# Patient Record
Sex: Female | Born: 1935 | Race: White | Hispanic: No | Marital: Married
Health system: Southern US, Community
[De-identification: ages and names within clinical notes are randomized; demographics above are authoritative.]

---

## 2000-03-27 ENCOUNTER — Encounter: Payer: Self-pay | Admitting: Thoracic Surgery (Cardiothoracic Vascular Surgery)

## 2000-03-27 ENCOUNTER — Inpatient Hospital Stay (HOSPITAL_COMMUNITY)
Admission: AD | Admit: 2000-03-27 | Discharge: 2000-04-02 | Payer: Self-pay | Admitting: Thoracic Surgery (Cardiothoracic Vascular Surgery)

## 2000-03-28 ENCOUNTER — Encounter: Payer: Self-pay | Admitting: Thoracic Surgery (Cardiothoracic Vascular Surgery)

## 2000-03-29 ENCOUNTER — Encounter: Payer: Self-pay | Admitting: Thoracic Surgery (Cardiothoracic Vascular Surgery)

## 2000-03-30 ENCOUNTER — Encounter: Payer: Self-pay | Admitting: Thoracic Surgery (Cardiothoracic Vascular Surgery)

## 2003-07-01 ENCOUNTER — Other Ambulatory Visit: Payer: Self-pay

## 2004-12-28 ENCOUNTER — Encounter: Admission: RE | Admit: 2004-12-28 | Discharge: 2004-12-28 | Payer: Self-pay | Admitting: Internal Medicine

## 2006-04-03 ENCOUNTER — Encounter: Admission: RE | Admit: 2006-04-03 | Discharge: 2006-04-03 | Payer: Self-pay | Admitting: Internal Medicine

## 2006-07-25 ENCOUNTER — Encounter: Admission: RE | Admit: 2006-07-25 | Discharge: 2006-07-25 | Payer: Self-pay | Admitting: Internal Medicine

## 2007-02-19 ENCOUNTER — Emergency Department: Payer: Self-pay | Admitting: Emergency Medicine

## 2007-02-19 ENCOUNTER — Other Ambulatory Visit: Payer: Self-pay

## 2008-05-24 ENCOUNTER — Ambulatory Visit: Payer: Self-pay | Admitting: Internal Medicine

## 2008-06-20 ENCOUNTER — Ambulatory Visit: Payer: Self-pay | Admitting: Internal Medicine

## 2008-07-31 ENCOUNTER — Emergency Department: Payer: Self-pay | Admitting: Emergency Medicine

## 2008-08-04 ENCOUNTER — Ambulatory Visit: Payer: Self-pay | Admitting: General Practice

## 2008-08-05 ENCOUNTER — Ambulatory Visit: Payer: Self-pay | Admitting: General Practice

## 2008-08-06 ENCOUNTER — Inpatient Hospital Stay: Payer: Self-pay | Admitting: Internal Medicine

## 2008-08-12 ENCOUNTER — Encounter: Payer: Self-pay | Admitting: Internal Medicine

## 2008-08-20 ENCOUNTER — Encounter: Payer: Self-pay | Admitting: Internal Medicine

## 2008-09-20 ENCOUNTER — Encounter: Payer: Self-pay | Admitting: Internal Medicine

## 2008-11-01 ENCOUNTER — Encounter: Payer: Self-pay | Admitting: General Practice

## 2008-11-18 ENCOUNTER — Encounter: Payer: Self-pay | Admitting: General Practice

## 2008-12-04 ENCOUNTER — Inpatient Hospital Stay: Payer: Self-pay | Admitting: Internal Medicine

## 2008-12-18 ENCOUNTER — Encounter: Payer: Self-pay | Admitting: General Practice

## 2010-04-04 ENCOUNTER — Ambulatory Visit: Payer: Self-pay | Admitting: Otolaryngology

## 2011-03-12 ENCOUNTER — Emergency Department: Payer: Self-pay | Admitting: Emergency Medicine

## 2011-06-04 ENCOUNTER — Ambulatory Visit: Payer: Self-pay | Admitting: Gastroenterology

## 2011-06-19 ENCOUNTER — Encounter: Payer: Self-pay | Admitting: Internal Medicine

## 2011-06-21 ENCOUNTER — Encounter: Payer: Self-pay | Admitting: Internal Medicine

## 2011-07-07 ENCOUNTER — Inpatient Hospital Stay: Payer: Self-pay | Admitting: Internal Medicine

## 2011-07-18 ENCOUNTER — Inpatient Hospital Stay: Payer: Self-pay | Admitting: Internal Medicine

## 2011-12-26 ENCOUNTER — Ambulatory Visit: Payer: Self-pay | Admitting: Internal Medicine

## 2011-12-26 LAB — RETICULOCYTES
Absolute Retic Count: 0.0732 10*6/uL (ref 0.024–0.084)
Reticulocyte: 2.24 % — ABNORMAL HIGH (ref 0.5–1.5)

## 2011-12-26 LAB — CANCER CENTER HEMOGLOBIN: HGB: 8.1 g/dL — ABNORMAL LOW (ref 12.0–16.0)

## 2011-12-26 LAB — IRON AND TIBC: Iron: 25 ug/dL — ABNORMAL LOW (ref 50–170)

## 2011-12-26 LAB — LACTATE DEHYDROGENASE: LDH: 167 U/L (ref 84–246)

## 2012-01-19 ENCOUNTER — Ambulatory Visit: Payer: Self-pay | Admitting: Internal Medicine

## 2012-09-18 ENCOUNTER — Inpatient Hospital Stay: Payer: Self-pay | Admitting: Internal Medicine

## 2012-09-18 LAB — URINALYSIS, COMPLETE
Bilirubin,UR: NEGATIVE
Glucose,UR: NEGATIVE mg/dL (ref 0–75)
Ketone: NEGATIVE
RBC,UR: NONE SEEN /HPF (ref 0–5)
WBC UR: 15 /HPF (ref 0–5)

## 2012-09-18 LAB — COMPREHENSIVE METABOLIC PANEL
Albumin: 3.3 g/dL — ABNORMAL LOW (ref 3.4–5.0)
Anion Gap: 8 (ref 7–16)
Bilirubin,Total: 0.2 mg/dL (ref 0.2–1.0)
Calcium, Total: 8.7 mg/dL (ref 8.5–10.1)
Creatinine: 1.32 mg/dL — ABNORMAL HIGH (ref 0.60–1.30)
EGFR (African American): 43 — ABNORMAL LOW
EGFR (Non-African Amer.): 37 — ABNORMAL LOW
Glucose: 71 mg/dL (ref 65–99)
Potassium: 3.9 mmol/L (ref 3.5–5.1)
SGPT (ALT): 18 U/L (ref 12–78)
Sodium: 139 mmol/L (ref 136–145)

## 2012-09-18 LAB — CBC
MCH: 26.5 pg (ref 26.0–34.0)
MCHC: 32 g/dL (ref 32.0–36.0)
MCV: 83 fL (ref 80–100)
Platelet: 328 10*3/uL (ref 150–440)
RBC: 3.64 10*6/uL — ABNORMAL LOW (ref 3.80–5.20)
RDW: 17 % — ABNORMAL HIGH (ref 11.5–14.5)
WBC: 11.7 10*3/uL — ABNORMAL HIGH (ref 3.6–11.0)

## 2012-09-18 LAB — TROPONIN I: Troponin-I: <0.02 ng/mL

## 2012-09-19 LAB — BASIC METABOLIC PANEL
BUN: 21 mg/dL — ABNORMAL HIGH (ref 7–18)
Potassium: 4.2 mmol/L (ref 3.5–5.1)
Sodium: 139 mmol/L (ref 136–145)

## 2012-09-19 LAB — CBC WITH DIFFERENTIAL/PLATELET
Eosinophil %: 0.9 %
HCT: 29.4 % — ABNORMAL LOW (ref 35.0–47.0)
MCH: 26 pg (ref 26.0–34.0)
Monocyte #: 0.7 x10 3/mm (ref 0.2–0.9)
Monocyte %: 5.5 %
Platelet: 324 10*3/uL (ref 150–440)
RBC: 3.54 10*6/uL — ABNORMAL LOW (ref 3.80–5.20)
RDW: 17.1 % — ABNORMAL HIGH (ref 11.5–14.5)
WBC: 12.7 10*3/uL — ABNORMAL HIGH (ref 3.6–11.0)

## 2012-09-19 LAB — LIPID PANEL: VLDL Cholesterol, Calc: 33 mg/dL (ref 5–40)

## 2012-09-20 LAB — BASIC METABOLIC PANEL
Anion Gap: 7 (ref 7–16)
BUN: 23 mg/dL — ABNORMAL HIGH (ref 7–18)
Calcium, Total: 7.8 mg/dL — ABNORMAL LOW (ref 8.5–10.1)
Chloride: 105 mmol/L (ref 98–107)
Co2: 25 mmol/L (ref 21–32)
Creatinine: 1.44 mg/dL — ABNORMAL HIGH (ref 0.60–1.30)
EGFR (African American): 39 — ABNORMAL LOW
EGFR (Non-African Amer.): 34 — ABNORMAL LOW
Glucose: 311 mg/dL — ABNORMAL HIGH (ref 65–99)
Osmolality: 289 (ref 275–301)
Potassium: 4.5 mmol/L (ref 3.5–5.1)
Sodium: 137 mmol/L (ref 136–145)

## 2012-09-20 LAB — CBC WITH DIFFERENTIAL/PLATELET
Eosinophil #: 0 10*3/uL (ref 0.0–0.7)
Eosinophil %: 0 %
HCT: 23.1 % — ABNORMAL LOW (ref 35.0–47.0)
Lymphocyte #: 0.9 10*3/uL — ABNORMAL LOW (ref 1.0–3.6)
Lymphocyte %: 8.7 %
MCH: 27 pg (ref 26.0–34.0)
MCHC: 32.2 g/dL (ref 32.0–36.0)
MCV: 84 fL (ref 80–100)
Monocyte #: 0.5 x10 3/mm (ref 0.2–0.9)
Neutrophil #: 8.4 10*3/uL — ABNORMAL HIGH (ref 1.4–6.5)
Neutrophil %: 85.6 %
RDW: 16.9 % — ABNORMAL HIGH (ref 11.5–14.5)

## 2012-09-20 LAB — URINE CULTURE

## 2012-09-21 LAB — BASIC METABOLIC PANEL
Anion Gap: 9 (ref 7–16)
BUN: 24 mg/dL — ABNORMAL HIGH (ref 7–18)
Calcium, Total: 8.5 mg/dL (ref 8.5–10.1)
Chloride: 99 mmol/L (ref 98–107)
Glucose: 210 mg/dL — ABNORMAL HIGH (ref 65–99)
Potassium: 4.3 mmol/L (ref 3.5–5.1)

## 2012-09-21 LAB — CBC WITH DIFFERENTIAL/PLATELET
Basophil %: 0.2 %
Eosinophil #: 0.1 10*3/uL (ref 0.0–0.7)
Eosinophil %: 0.7 %
HGB: 7.6 g/dL — ABNORMAL LOW (ref 12.0–16.0)
Lymphocyte #: 1.5 10*3/uL (ref 1.0–3.6)
Lymphocyte %: 11.6 %
MCH: 26.4 pg (ref 26.0–34.0)
MCHC: 31.7 g/dL — ABNORMAL LOW (ref 32.0–36.0)
Neutrophil %: 79.8 %
Platelet: 271 10*3/uL (ref 150–440)
RDW: 17 % — ABNORMAL HIGH (ref 11.5–14.5)
WBC: 13 10*3/uL — ABNORMAL HIGH (ref 3.6–11.0)

## 2012-09-22 LAB — CBC WITH DIFFERENTIAL/PLATELET
Basophil #: 0 10*3/uL (ref 0.0–0.1)
Basophil %: 0.2 %
MCV: 83 fL (ref 80–100)
Monocyte %: 6.2 %
Platelet: 295 10*3/uL (ref 150–440)
RBC: 2.94 10*6/uL — ABNORMAL LOW (ref 3.80–5.20)
RDW: 17 % — ABNORMAL HIGH (ref 11.5–14.5)

## 2012-09-22 LAB — BASIC METABOLIC PANEL
Anion Gap: 8 (ref 7–16)
BUN: 20 mg/dL — ABNORMAL HIGH (ref 7–18)
EGFR (African American): 57 — ABNORMAL LOW
Potassium: 4.1 mmol/L (ref 3.5–5.1)

## 2012-09-25 LAB — CBC WITH DIFFERENTIAL/PLATELET
Basophil %: 0.2 %
HGB: 7.2 g/dL — ABNORMAL LOW (ref 12.0–16.0)
Lymphocyte #: 1.4 10*3/uL (ref 1.0–3.6)
Lymphocyte %: 12.7 %
MCHC: 31.6 g/dL — ABNORMAL LOW (ref 32.0–36.0)
Monocyte %: 8.2 %
Neutrophil #: 8.5 10*3/uL — ABNORMAL HIGH (ref 1.4–6.5)
Platelet: 289 10*3/uL (ref 150–440)
RDW: 16.6 % — ABNORMAL HIGH (ref 11.5–14.5)
WBC: 11.2 10*3/uL — ABNORMAL HIGH (ref 3.6–11.0)

## 2012-09-26 ENCOUNTER — Encounter: Payer: Self-pay | Admitting: Internal Medicine

## 2012-10-18 ENCOUNTER — Encounter: Payer: Self-pay | Admitting: Internal Medicine

## 2012-10-29 LAB — URINALYSIS, COMPLETE
Bilirubin,UR: NEGATIVE
Ketone: NEGATIVE
Nitrite: NEGATIVE
Ph: 7 (ref 4.5–8.0)
RBC,UR: 16 /HPF (ref 0–5)
WBC UR: 312 /HPF (ref 0–5)

## 2012-11-03 LAB — URINE CULTURE

## 2012-11-07 LAB — URINALYSIS, COMPLETE
Glucose,UR: 50 mg/dL (ref 0–75)
Nitrite: NEGATIVE
Ph: 5 (ref 4.5–8.0)
RBC,UR: 160 /HPF (ref 0–5)
Squamous Epithelial: 21

## 2012-11-13 LAB — URINE CULTURE

## 2012-11-14 LAB — URINALYSIS, COMPLETE
Bacteria: NONE SEEN
Bilirubin,UR: NEGATIVE
Ketone: NEGATIVE
Specific Gravity: 1.005 (ref 1.003–1.030)
WBC UR: 1 /HPF (ref 0–5)

## 2012-11-15 LAB — URINE CULTURE

## 2012-11-18 ENCOUNTER — Encounter: Payer: Self-pay | Admitting: Internal Medicine

## 2012-11-24 LAB — URINALYSIS, COMPLETE
Glucose,UR: NEGATIVE mg/dL (ref 0–75)
Leukocyte Esterase: NEGATIVE
Protein: NEGATIVE
RBC,UR: <1 /HPF (ref 0–5)
Specific Gravity: 1.006 (ref 1.003–1.030)
Transitional Epi: 1
WBC UR: 2 /HPF (ref 0–5)

## 2012-11-26 LAB — URINE CULTURE

## 2013-02-21 ENCOUNTER — Emergency Department: Payer: Self-pay | Admitting: Emergency Medicine

## 2013-02-22 LAB — URINALYSIS, COMPLETE
Nitrite: POSITIVE
Protein: NEGATIVE
Specific Gravity: 1.014 (ref 1.003–1.030)
Squamous Epithelial: NONE SEEN
WBC UR: 92 /HPF (ref 0–5)

## 2013-02-22 LAB — PROTIME-INR
INR: 1.1
Prothrombin Time: 14.5 secs (ref 11.5–14.7)

## 2013-02-22 LAB — CK TOTAL AND CKMB (NOT AT ARMC): CK, Total: 48 U/L (ref 21–215)

## 2013-02-22 LAB — COMPREHENSIVE METABOLIC PANEL
Alkaline Phosphatase: 96 U/L (ref 50–136)
Chloride: 104 mmol/L (ref 98–107)
Creatinine: 1.32 mg/dL — ABNORMAL HIGH (ref 0.60–1.30)
Glucose: 80 mg/dL (ref 65–99)
Potassium: 4.2 mmol/L (ref 3.5–5.1)
SGPT (ALT): 14 U/L (ref 12–78)

## 2013-02-22 LAB — CBC WITH DIFFERENTIAL/PLATELET
Eosinophil %: 1.7 %
HGB: 6.5 g/dL — ABNORMAL LOW (ref 12.0–16.0)
Lymphocyte %: 9.2 %
MCH: 23.1 pg — ABNORMAL LOW (ref 26.0–34.0)
Monocyte %: 8.1 %
Neutrophil #: 11.1 10*3/uL — ABNORMAL HIGH (ref 1.4–6.5)

## 2013-02-22 LAB — APTT: Activated PTT: 37.7 secs — ABNORMAL HIGH (ref 23.6–35.9)

## 2013-03-20 DEATH — deceased

## 2013-09-12 IMAGING — CR DG ANKLE 2V *L*
1 series · 1 of 1 positions shown · non-contrast
Comparison: none

REASON FOR EXAM: Fall
COMMENTS:

PROCEDURE:     DXR - DXR ANKLE LEFT AP AND LATERAL  - February 22, 2013  [DATE]
RESULT:     Comparison: Earlier same day.

[ap]
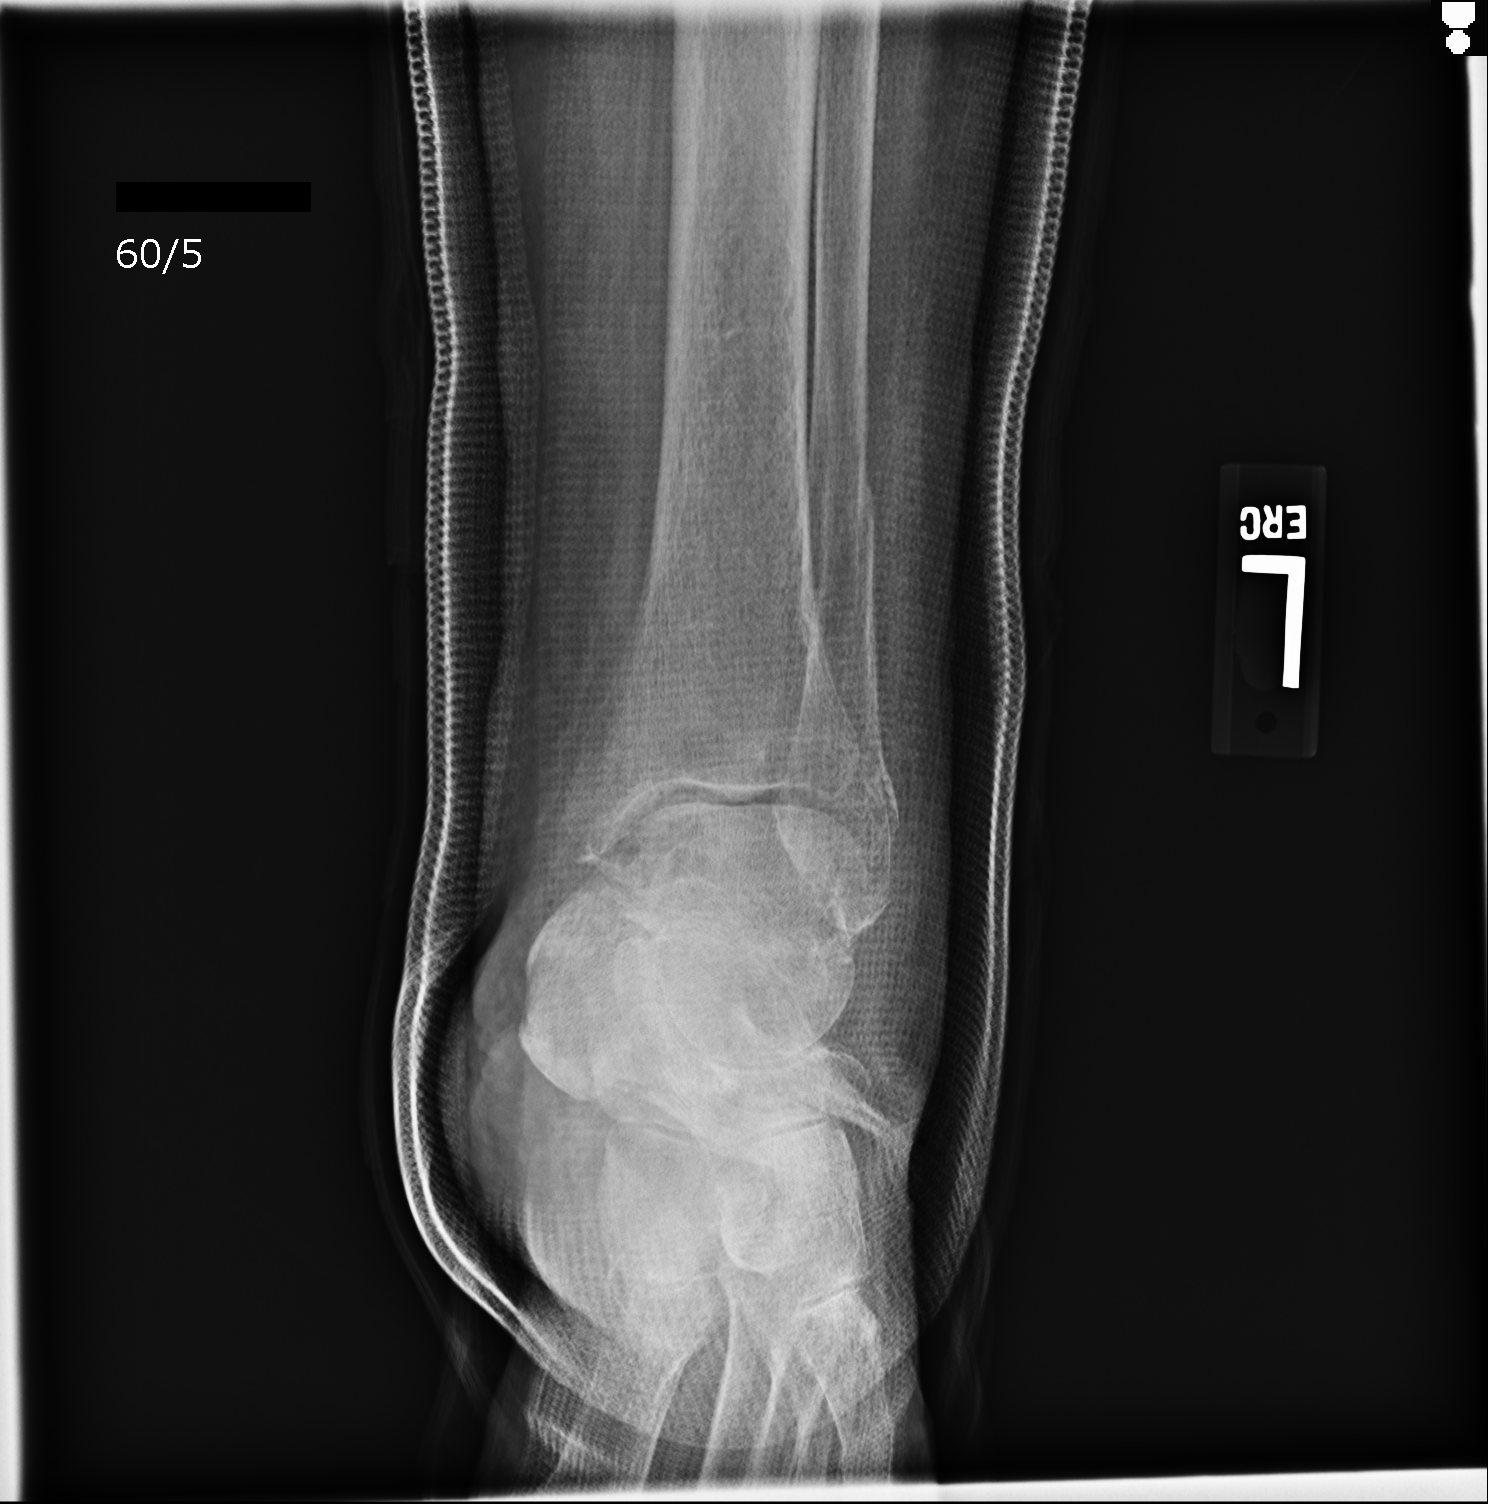

[1 of 1 positions shown; findings below may reference images not displayed]

FINDINGS: Evaluation of time bony detail is limited by overlying cast material.
Evaluation limited by single view. There is improved alignment of the
fracture-dislocation of the ankle on this single view.
IMPRESSION: Please see above.

## 2013-09-12 IMAGING — CR DG CHEST 1V PORT
1 series · 1 of 1 positions shown · non-contrast
Comparison: none

REASON FOR EXAM: trauma
COMMENTS:

PROCEDURE:     DXR - DXR PORTABLE CHEST SINGLE VIEW  - February 22, 2013 [DATE]
RESULT:     Comparison: 09/18/2012

[ap]
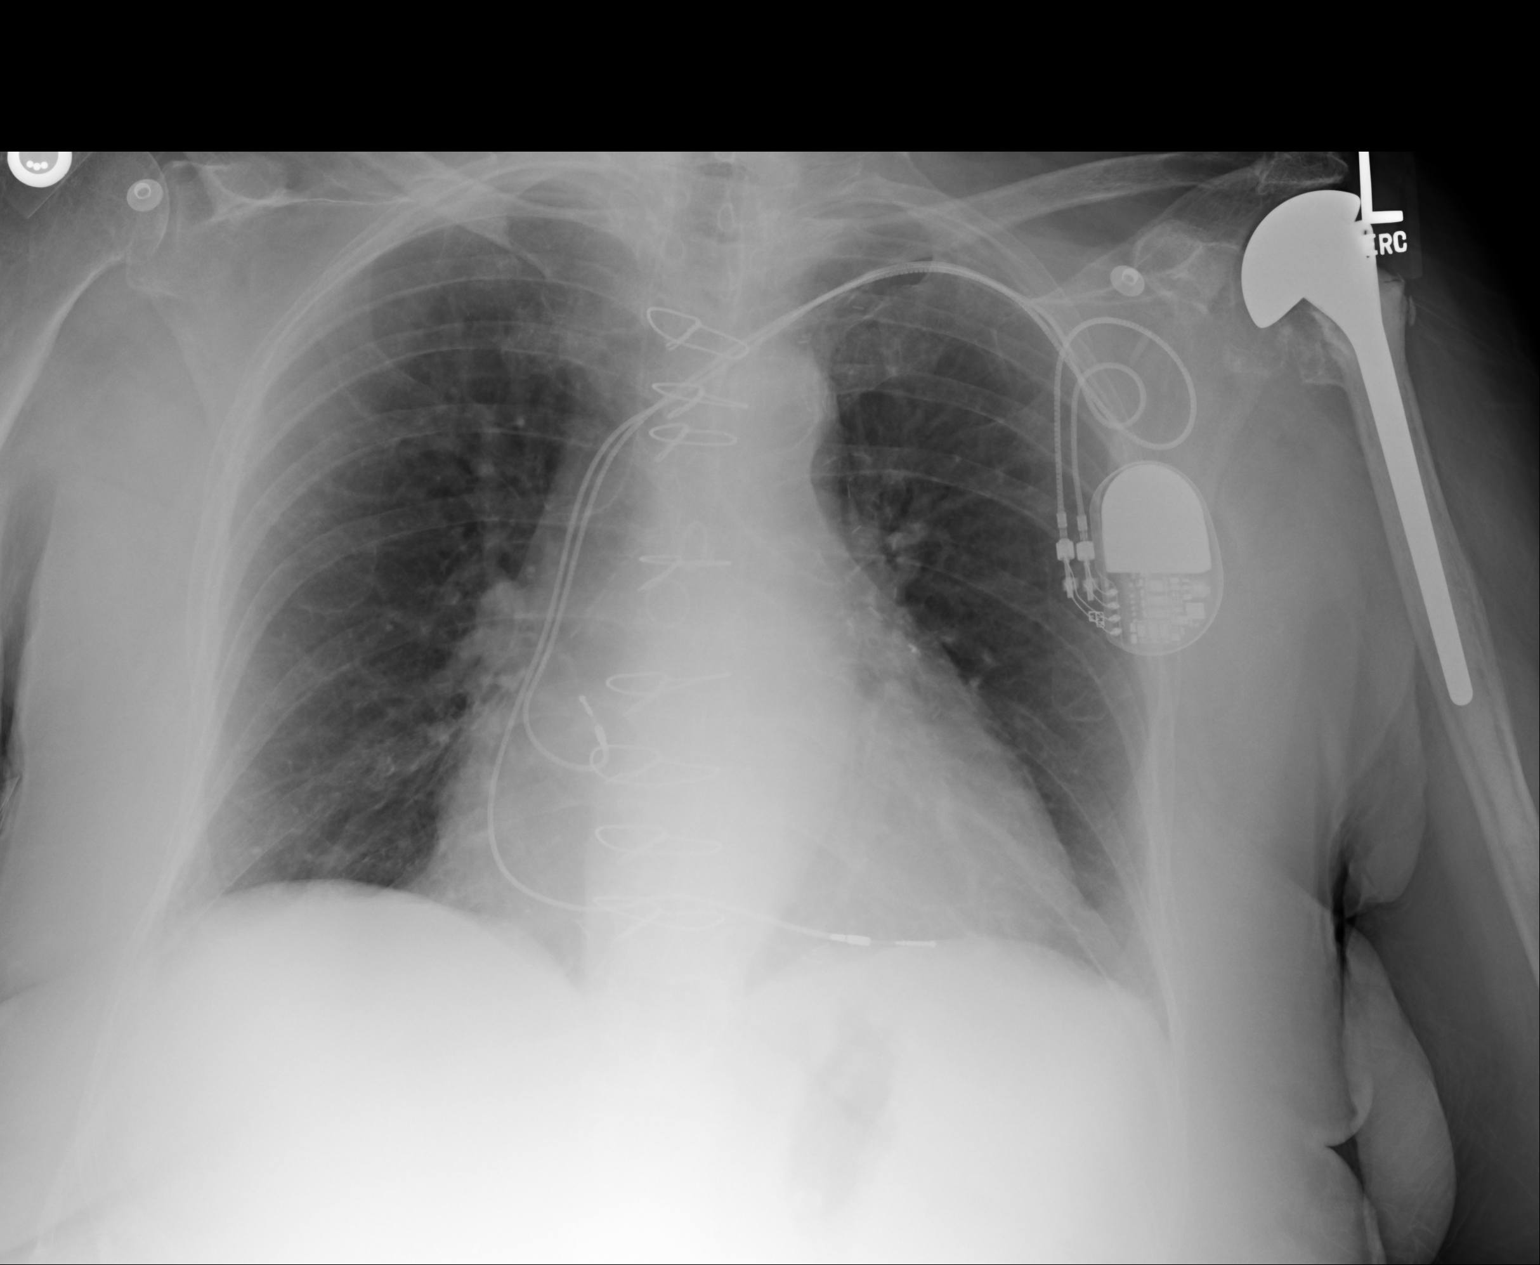

[1 of 1 positions shown; findings below may reference images not displayed]

FINDINGS: Cardiomegaly and the mediastinum are stable. Prior median sternotomy and
CABG. Wires are seen from dual-lead pacemaker. No focal pulmonary opacities.
Left shoulder hemiarthroplasty.
IMPRESSION: No acute cardiopulmonary disease.

## 2013-09-12 IMAGING — CR DG TIBIA/FIBULA 2V*L*
1 series · 1 of 1 positions shown · non-contrast
Comparison: none

REASON FOR EXAM: trauma
COMMENTS:

PROCEDURE:     DXR - DXR TIBIA AND FIBULA LT (LOWER L  - February 22, 2013  [DATE]
RESULT:     Comparison: None.

[ap]
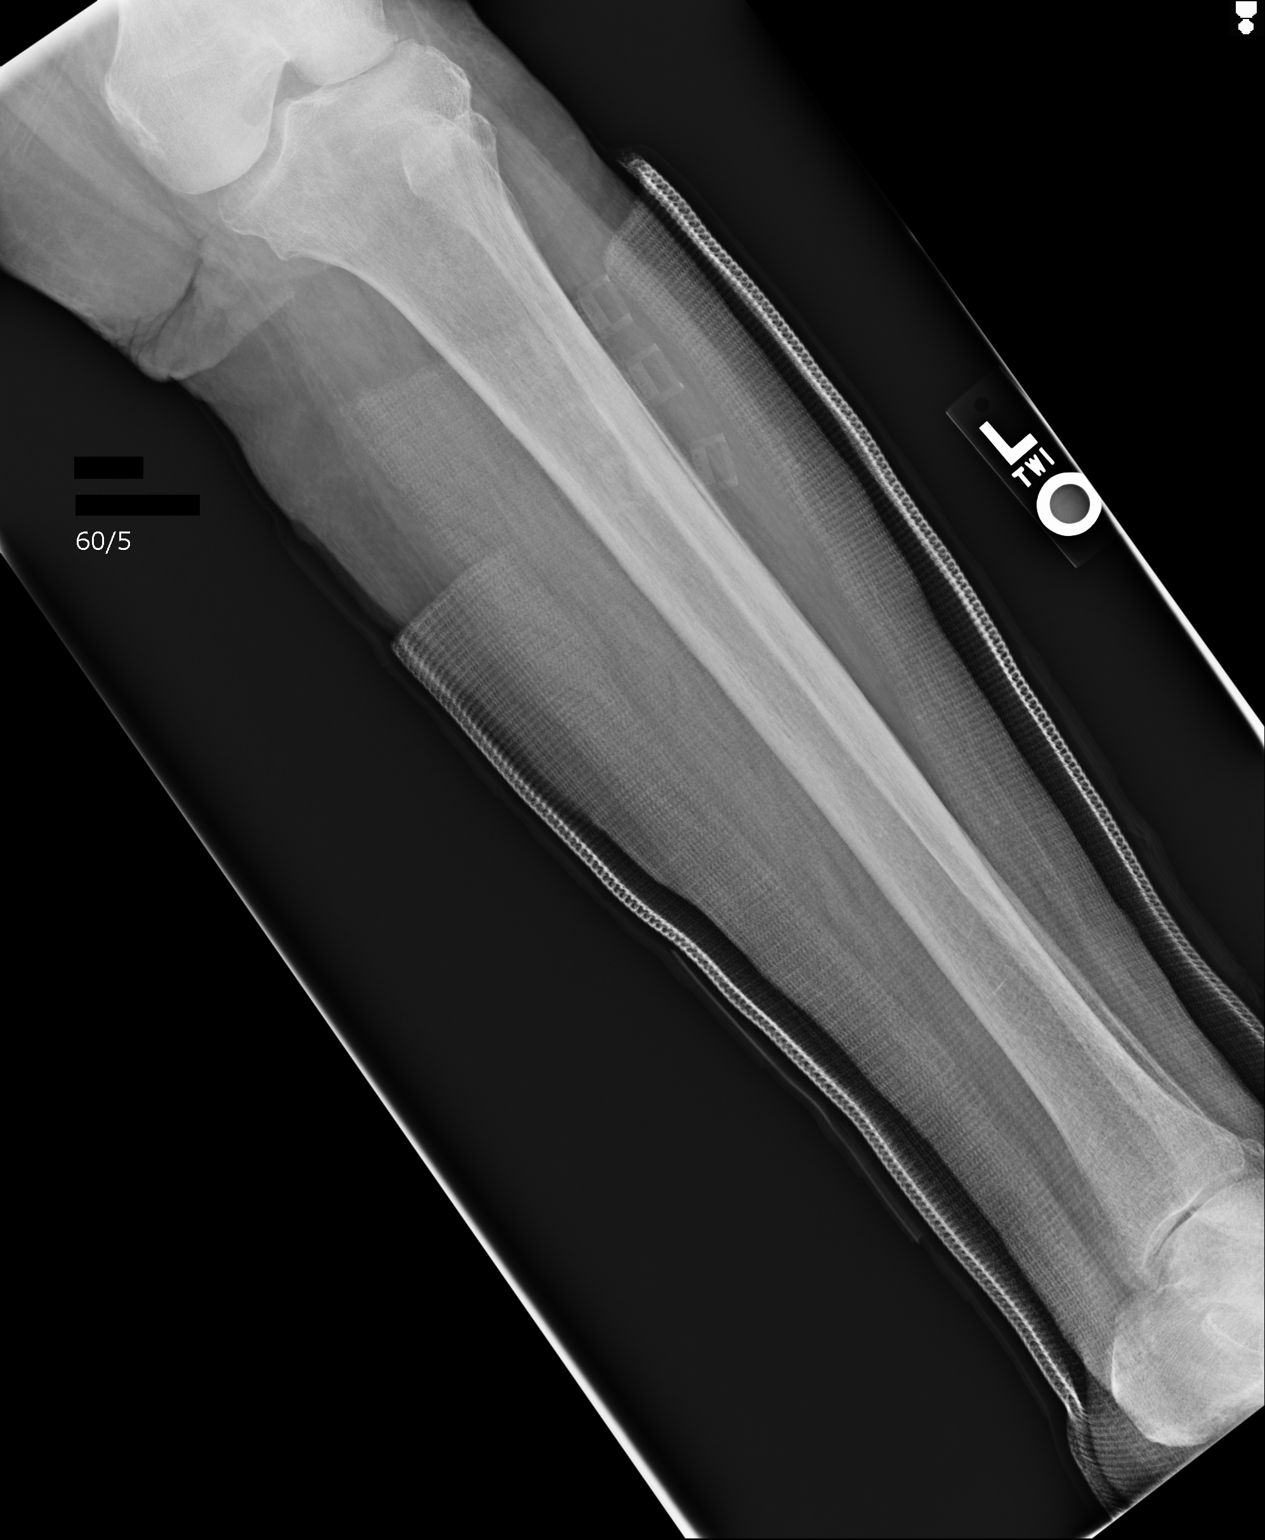

[1 of 1 positions shown; findings below may reference images not displayed]

FINDINGS: Evaluation limited by single view. Overlying cast material limits evaluation
of fine bony detail. No definite fracture seen.
IMPRESSION: Please see above.

## 2013-09-12 IMAGING — CR PELVIS - 1-2 VIEW
1 series · 1 of 1 positions shown · non-contrast
Comparison: none

REASON FOR EXAM: trauma
COMMENTS:

PROCEDURE:     DXR - DXR PELVIS AP ONLY  - February 22, 2013  [DATE]
RESULT:     Comparison: 09/18/2012.

[ap]
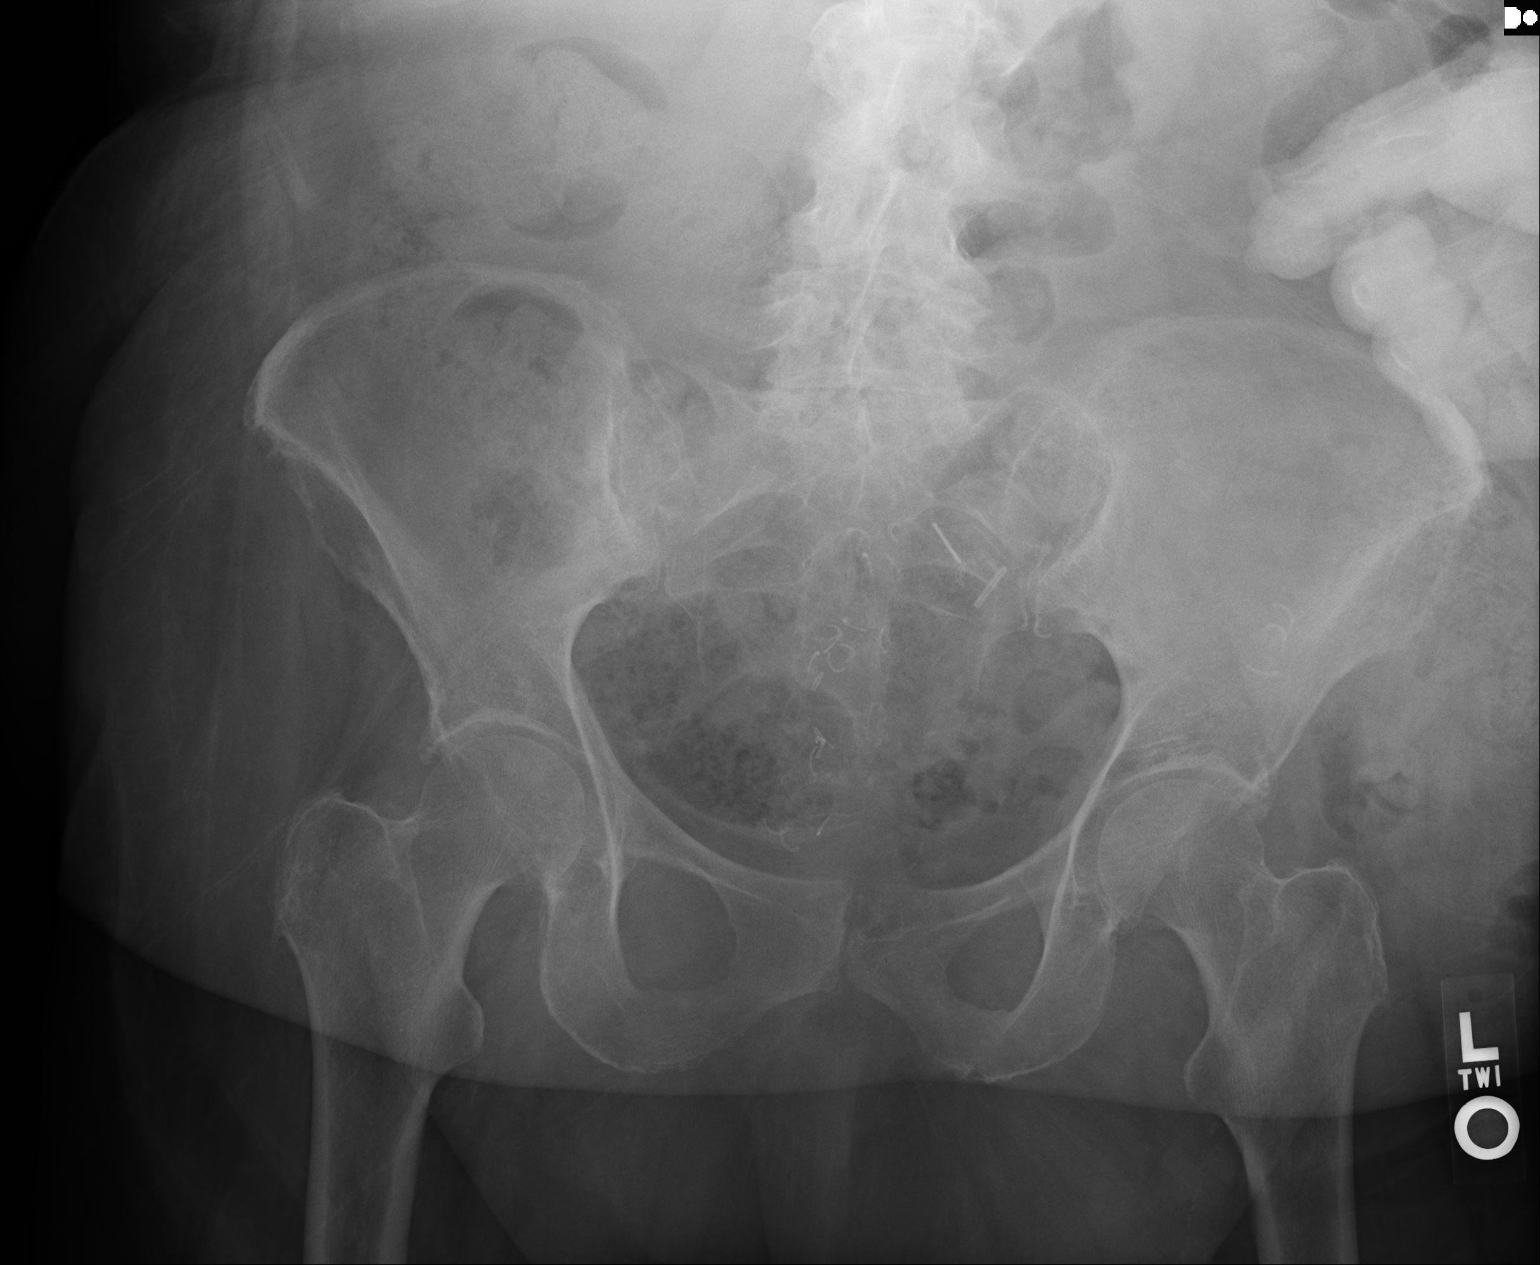

[1 of 1 positions shown; findings below may reference images not displayed]

FINDINGS: No acute fracture seen. Hip joint spaces are maintained. Multiple surgical
clips overlie the pelvis.
IMPRESSION: No fracture seen. Given the patient's age and relative bone density, if
there is continued clinical concern for occult hip fracture, further
evaluation with MRI would be recommended.

## 2014-12-10 NOTE — Consult Note (Signed)
Feels better. Less nausea today. EGD showed moderate esophagits, likely from candidiasis. Cytology brushings done. Some food remnant in fundus of stomach. Diflucan daily x 5 days. Increased protonix to bid short term. Thanks.  Electronic Signatures: Lutricia Feilh, Santo Zahradnik (MD)  (Signed on 06-Feb-14 10:18)  Authored  Last Updated: 06-Feb-14 10:18 by Lutricia Feilh, Naw Lasala (MD)

## 2014-12-10 NOTE — H&P (Signed)
PATIENT NAME:  Kristin Roberson, SESSLER MR#:  [id number] DATE OF BIRTH:  [birth year]  DATE OF ADMISSION:  09/18/2012  PRIMARY CARE PHYSICIAN:  Marya Amsler. Dareen Piano, MD  REFERRING PHYSICIAN:  Sheryl L. Mindi Junker, MD  PRIMARY CARDIOLOGIST:  Darlin Priestly. Lady Gary, MD  CHIEF COMPLAINT:  Syncopal episode and fall.   HISTORY OF PRESENT ILLNESS:  An 81 year old female with past medical history of coronary artery disease status post CABG status post permanent pacemaker, colorectal cancer status post rectal resection and status post colostomy, hypertension, diabetes mellitus, asthma and hyperlipidemia who came to the Emergency Room because of fall. Usually at home, she walks with a walker and she is able to do her daily activities. Today, she was sitting on the commode and finishing emptying of the colostomy bag. After that event, she tried to stand up, she felt a little dizzy and she fell down on the floor. She said that she felt everything was rotating around her, but denies any chest pain, palpitation or shortness of breath with this episode. On further questioning, she says that for the last few days she has been feeling similar feelings whenever she tried to stand up from lying down or sitting position, but somehow she _____ herself and then she feels fine while walking with a walker. In the ER, she was found having her CAT scan of the head normal, but she had a fractured tibia on the right side and fractured fibula on the left side and so she needs an orthopedic procedure, but due to a multiple complicated medical history, she is being admitted to medical service.   REVIEW OF SYSTEMS:   CONSTITUTIONAL: Negative for fever, fatigue, weakness, pain or weight loss.  EYES: No blurring or double. No redness or discharge from the eyes.  ENT: No tinnitus, ear pain, hearing loss or epistaxis.  RESPIRATORY: No cough, wheezing, hemoptysis, dyspnea or respiratory failure.  CARDIOVASCULAR: No chest pain, orthopnea, edema, or  arrhythmia. Had syncopal episode as mentioned in history of present illness.  GASTROINTESTINAL: Has been feeling nauseous for the last 2 to 3 days, but no vomiting or loose stools. She was trying to eat normally as she usually eats. No jaundice.  GENITOURINARY: Denies any dysuria, hematuria or increased frequency of urination.  ENDOCRINE: No heat or cold intolerance. No increased sweating.  SKIN: No rashes or lesions on the skin.  MUSCULOSKELETAL: No pain or swelling of the joints. Has broken leg bones after the fall and is painful because of that.  NEUROLOGICAL: No numbness, weakness, tremors. Had episode of syncope as mentioned above.  PSYCHIATRIC: No anxiety, insomnia or depression.   PAST MEDICAL HISTORY:   1.  Coronary artery disease.  2.  Coronary artery bypass graft status post permanent pacemaker due to bradycardia.  3.  Colorectal cancer status post rectal resection and colostomy.  4.  Diabetes mellitus with neuropathy.  5.  Asthma.  6.  Gastroesophageal reflux disease.  7.  Hyperlipidemia.   PAST SURGICAL HISTORY:   1.  CABG.  2.  Hysterectomy.  3.  Left shoulder surgery due to fracture.  4.  Colon resection and colostomy.  5.  Carpal tunnel surgery.  6.  Back surgery.   ALLERGIES:  SHELLFISH, IODINE, SULFA.   FAMILY HISTORY:  Father died at 31 because of kidney failure and also had heart failure. Mother died at 44 because of heart failure. Brother died of lung cancer and older sister died of myocardial infarction.   SOCIAL HISTORY:  No smoking. No alcohol.  No drug use. She worked in a _____ in the past. Lives at home with her husband and uses a walker to walk around.   HOME MEDICATIONS:  Advair Diskus 1 puff 2 times a day, amlodipine 5 mg oral tablet once a day, aspirin 81 mg once a day, carvedilol 12.5 mg 2 times a day, Crestor 5 mg oral once a day, escitalopram 20 mg oral tablet once a day, gabapentin 300 mg once a day, Humalog 26 units subcutaneous once a day in the  morning at breakfast, 20 units once a day at lunch and 22 units once a day at dinner, Levemir subcutaneous solution 10 units subcutaneous once a day in the morning and 36 units at bedtime, lorazepam 1 mg oral tablet 0.5 to 1 tablet every 4 hours as needed for anxiety, methocarbamol 500 mg oral tablet 1 to 2 tablets once a day at bedtime as needed for muscle spasm, Nexium 40 mg oral delayed-release capsule 2 times a day, Tandem 162 mg oral capsule once a day, tramadol 50 mg oral tablet 1 to 2 tablets every 6 hours as needed for pain, Ventolin HFA inhalation aerosol 2 puffs 4 times a day.   PHYSICAL EXAMINATION:  VITAL SIGNS: Temperature 97.6, pulse rate 75, respirations 20, blood pressure 104/50, pulse oximetry 88% on room air which was 92% on oxygen supplementation.  GENERAL: She is fully alert and oriented to time, place and person and cooperative to history taking and physical examination.  HEAD AND NECK: Atraumatic. Conjunctivae pink. Mucosa moist. No JVD. Neck is supple.  CARDIOVASCULAR: S1, S2 present, pacemaker in place. Systolic murmur heard. Regular rhythm.  RESPIRATORY: Bilateral clear and equal air entry.  ABDOMEN: Soft, nontender, obese, colostomy bag in place in left periumbilical region.  LOWER EXTREMITIES: Right-sided immobilization, cast present. Left side no edema. Able to move toes on both sides. Sensation preserved on both sides.  SKIN: No rashes.  JOINTS: No tenderness or swelling.  NEUROLOGICAL: Power 5 out of 5 in all 4 limbs except right lower limb which she is not able to move much due to pain and cast.   DIAGNOSTIC DATA:  Glucose 71, BUN 17, creatinine 1.32, sodium 139, potassium 3.9, chloride 104, CO2 is 27, calcium 8.7. Albumin is 3.3, SGOT 66, SGPT 18. Troponin is less than 0.02. WBC is 11.7, RBC 3.64, hemoglobin 9.6, platelet count 328. Prothrombin is 13.3, INR 1.0. Urinalysis: WBC 15 and leukocyte esterase 1+. CT of the head showed changes of atrophy and chronic  microvascular ischemic changes. No acute intracranial abnormality. On x-ray of chest, the findings are consistent with CHF with mild interstitial edema. Left iliac region pubic fracture _____ dedicated pubic bone images are recommended. Tibia and fibula left distal fibula fracture _____ with casting and splint material present. Left tibia and fibula shows a fracture in distal third. Tibia and fibula right sustained a spiral fracture of the distal tibia, transversely oriented fracture of distal tibia and a fracture through the proximal one third of the shaft of the fibula on the right side.   ASSESSMENT AND PLAN:  An 81 year old female with multiple complicated past medical history presented with syncopal episode and fall and have multiple fractures. Significant lab is urinalysis positive.  1.  Syncopal episode possibly due to urinary tract infection. We will do urine culture and continue her on Rocephin.  2.  Extensive cardiac history status post pacemaker and coronary artery bypass graft. We will monitor her on telemetry and do troponins x 3 due to  syncopal episode and will call a cardiology consult for Dr. Lady Gary.  3.  _____ hypotensive. May be due to infection and as she was complaining she was feeling dizzy for the last few days, this might be the reason for this. We will hold all hypertensive medications at this point.  4.  Diabetes. We will do glucose checks with coverage, but we will not give any standing insulin order at this point as she might be n.p.o. for surgery for tomorrow.  5.  Colon cancer status post colectomy. It is a stable issue.  6.  Mild acute renal failure may be due to urinary tract infection and dehydration. We will hydrate her and monitor.  7.  Asthma. We will continue her home medications and nebulizers.  8.  Deep vein thrombosis prophylaxis with Lovenox subcutaneously.  9.  Gastrointestinal prophylaxis with Nexium p.o.  10.  Pain management for orthopedic fractures, tibia and  fibula on the right, fractured fibula on the left and possible pelvic fracture. Orthopedist, Dr. Deeann Saint, is following the patient. We will plan surgery once medically stable. Healthcare proxy is daughter, Ms. Tniya Bowditch at [phone].   CODE STATUS:  DO NOT RESUSCITATE.   TOTAL TIME SPENT:  55 minutes.    ____________________________ Hope Pigeon Elisabeth Pigeon, MD vgv:si D: 09/18/2012 20:17:00 ET T: 09/18/2012 22:09:50 ET JOB#: [id number]  cc: Hope Pigeon. Elisabeth Pigeon, MD, <Dictator> Altamese Dilling MD ELECTRONICALLY SIGNED 10/10/2012 18:03

## 2014-12-10 NOTE — Discharge Summary (Signed)
PATIENT NAME:  Kristin Roberson, Ashantia K MR#:  [id number] DATE OF BIRTH:  08-03-1928  DATE OF ADMISSION:  09/18/2012 DATE OF PLANNED DISCHARGE:  09/26/2012   DISCHARGE DIAGNOSES:  1. Ankle fracture, treated by orthopedics.  2. Encephalopathy.  3. Urinary tract infection, causing weakness and falls.  4. Nausea and candidal esophagitis, with retained food on esophagogastroduodenoscopy as well.  5. Inability to tolerate opioids, making pain control difficult.  6. Asthma, with wheezing at times.   DISCHARGE MEDICATIONS: Per medication reconciliation sheet.   HISTORY AND PHYSICAL: Please see detailed history and physical done on admission.   HOSPITAL COURSE: The patient was admitted with fracture as noted, signs of urinary tract infection. Seen by orthopedics with Dr. Katrinka BlazingSmith. Surgical correction was eventually done. Please see his notes for details on this. Casting was eventually done as well. Progress was slowed by nausea, vomiting and inability to take p.o. well. She had multiple change in medications. Scopolamine patch was started. Opioid analgesics were changed. Eventually we realized that she did not take her Lexapro once daily at home as prescribed. She took it p.r.n. and only rarely. This was stopped, and her gabapentin was decreased as well. She seemed to improve after that and with Diflucan started for the candidal esophagitis found on EGD done yesterday by Dr. Bluford Kaufmannh in consultation for the nausea. Outside of some mild confusion, she seemed to be relatively at baseline, taking p.o. well, so will transfer her to a skilled facility today if possible. Weight-bearing status should be per orthopedic recommendations.   It took approximately 34 minutes to do all the discharge tasks today.   ____________________________ Marya AmslerMarshall W. Dareen PianoAnderson, MD mwa:OSi D: 09/26/2012 08:04:44 ET T: 09/26/2012 08:36:22 ET JOB#: [id number]  cc: Marya AmslerMarshall W. Dareen PianoAnderson, MD, <Dictator> Lauro RegulusMARSHALL W Tristin Gladman MD ELECTRONICALLY SIGNED  09/28/2012 18:21

## 2014-12-10 NOTE — Consult Note (Signed)
PATIENT NAME:  Kristin Roberson, Kristin Roberson MR#:  [id number] DATE OF BIRTH:  01-28-1928  DATE OF CONSULTATION:  09/24/2012  REFERRING PHYSICIAN:   CONSULTING PHYSICIAN:  Rodman Key, NP  PRIMARY CARE PHYSICIAN: Einar Crow, MD  PRIMARY CARDIOLOGIST: Harold Hedge, MD  ORTHOPEDIST:  Deeann Saint, MD   REASON FOR CONSULTATION: Nausea.     HISTORY OF PRESENT ILLNESS: The patient is an 81 year old Caucasian female who was admitted on 09/18/2012 for the concern of having fallen at home. The patient states that she felt like she was going to vomit, went to the bathroom, had eaten actually before that time. Husband said she got up, grabbed on to the sink and then unfortunately fell. She has sustained a fracture to the tibia on the right and left fibula fracture. She has undergone orthopedic surgery under the care of Dr. Hyacinth Meeker. The patient states that at home for the past 1 to 2 weeks that she has been experiencing dysphagia, difficulty swallowing. This is noted with solid foods as well as liquids getting hung occasionally. She has tried to cut her food up well,  taking small bites, chewing her food well to try to correct this concern. Prior to admission though she really had not been having a great deal of nausea or vomiting. No abdominal pain.   PAST MEDICAL HISTORY:  Significant for diabetes mellitus, diagnosed about five years ago as well as colon cancer status post resection and colostomy performed by Dr. Michela Pitcher. Most recent colonoscopy was done on 06/04/2011 by Dr. Lutricia Feil. Indication for procedure was history of malignant rectal neoplasm, change in bowel habits. Impression was that the prep was poor, diverticulosis, one polyp in the proximal transverse colon was noted. It does state though that was unable to reach with biopsy forceps, scope looped in the proximal TC, despite manual pressure is unable to advance further. The patient believed that she had an upper endoscopy done during this timeframe. In  review though of the EMR, unable to locate EGD report going back numerous years.   The patient states that since she has been admitted the nausea has progressed, dry heaving at times, gagging, unable to keep water down. Husband does state that she ate some green peas yesterday, Jell-O, and was able to drink some tea. She has a known history of reflux and is compliant with taking Nexium 40 mg twice a day prior to her meals and Tums as needed. She has not had a bowel movement since being admitted, up until last night, just a small amount. The patient has not noticed any evidence of bright red blood or black-colored stool in her colostomy bag.  She has been experiencing some abdominal discomfort in the abdomen and does have evidence of a hernia involving colostomy site to left lower quadrant.   REVIEW OF SYSTEMS: CONSTITUTIONAL: Denies any fevers. Significant for weakness since being admitted, but prior to that none.  EYES: No double vision or blurred vision.  ENT: No ear pain, hearing loss, or epistaxis.  RESPIRATORY: No coughing, no wheezing.  CARDIOVASCULAR: No chest pain, edema. Significant for syncopal episode 09/18/2012.  GASTROINTESTINAL: See history of present illness. No documented history of gastroparesis.  GENITOURINARY: No dysuria or hematuria.  ENDOCRINE: No heat or cold intolerance.  SKIN: No rashes. No lesions.  MUSCULOSKELETAL: Significant for fracture of the lower extremities as noted under HPI.  NEUROLOGIC: History of syncopal episode, which apparently has been recurrent. According to the patient, this is the worst one she sustained.  PSYCH: No depression, no anxiety.   PAST MEDICAL HISTORY: Coronary artery disease status post a permanent pacemaker due to bradycardia, colorectal cancer status post rectal resection and colostomy, diabetes mellitus with neuropathy, asthma, GERD and hyperlipidemia, syncopal episode rationale for admission, sustained tibia fracture right lower extremity  and fibula fracture left lower extremity.   PAST SURGICAL HISTORY: CABG, hysterectomy, left shoulder surgery due to fracture, colon resection with colostomy, carpal tunnel surgery, back surgery, tib fracture with repair, fibular  fracture with repair January 2014.  ALLERGIES:  SHELLFISH, IODINE, AND SULFA.  FAMILY HISTORY:  Father had history of renal failure, deceased, heart failure. Mother, 81, history of heart failure, brother with lung cancer and sister myocardial infarction.   SOCIAL HISTORY: No tobacco. No alcohol use. Resides with her husband, retired and normally ambulates with a walker.   HOME MEDICATIONS: Advair Diskus 1 puff twice a day, amlodipine 5 mg daily, aspirin 81 mg a day, carvedilol 12.5 mg twice a day, Crestor 5 mg once a day, Celexa 20 mg once a day, gabapentin 300 mg once a day, Humalog 26 units subcutaneous in the morning and 20 units at lunch and 22 units at dinner, Levemir subcutaneous 10 units once a day involving breakfast and 36 units at bedtime, lorazepam 1 mg tablet with 1/2 tablet to 1 tablet every 4 hours as needed for anxiety,  methocarbamol 500 mg 1 to 2 tablets once a day at bedtime as needed for muscle relaxer, Nexium 40 mg twice a day, Tandem 162 mg 1 capsule once a day, tramadol 50 mg 1 to 2 tablets every 6 hours as needed, Ventolin HFA inhalation aerosol 2 puffs 4 times daily.   PHYSICAL EXAMINATION: VITAL SIGNS: Temperature 98.5, pulse 67, respirations 18, blood pressure 164/69 and pulse oximetry is 98% on room air.  GENERAL: Well-developed, overweight 81 year old Caucasian female who appears to be resting comfortably in bed, weak appearance, color pale, husband present at bedside.  HEENT: Normocephalic, atraumatic. Pupils equal and reactive to light. Conjunctivae pale.  NECK: Supple. Trachea midline. No lymphadenopathy or thyromegaly.  PULMONARY: Symmetric rise and fall of chest. Clear to auscultation throughout anteriorly.  CARDIOVASCULAR: Regular  rhythm, S1, S2. No murmurs or gallops.  ABDOMEN: Soft, nondistended. Evidence of colostomy bag left lower quadrant. Dressing intact. No evidence of drainage. Bowel sounds were hypoactive.  RECTAL: Deferred.  MUSCULOSKELETAL: Gait not assessed as a brace is present to the left lower extremity and  cast to right lower extremity. Able to wiggle her toes and movement of upper extremities.  SKIN: Color pale, warm, dry extremities unable to assess for edema.  PSYCHIATRIC: Alert and oriented x 4, flat affect, depressive mood.  NEUROLOGICAL: No gross neurological deficits.   LABORATORY, DIAGNOSTIC AND RADIOLOGICAL DATA: All laboratory studies as well as imaging studies reviewed by myself from her hospitalization. Most recent was a KUB which was done on 09/23/2012 gaseous distention of the stomach and nasogastric suction may be useful. Bowel gas pattern is nonspecific.   IMPRESSION: Syncopal episode. Experiencing nausea, known history of dysphagia. The patient states that she has had an upper endoscopy with dilatation but unable to locate report to review of EMR. A known history of diabetes mellitus, increased risk for gastroparesis, but this has not been chronic, complaint of nausea and vomiting with early satiety or abdominal pain.   PLAN: The patient's presentation will be discussed with Dr. Lutricia Feil and care plan to follow in addendum form.   These services provided by Rodman Key, NP  under collaborative agreement with Lutricia FeilPaul Oh, MD.    ____________________________ Rodman Keyawn S. Shaianne Nucci, NP dsh:cc D: 09/24/2012 14:29:48 ET T: 09/24/2012 16:39:39 ET JOB#: [id number]  cc: Rodman Keyawn S. Horace Lukas, NP, <Dictator> Rodman KeyAWN S Elmyra Banwart MD ELECTRONICALLY SIGNED 09/29/2012 7:49

## 2014-12-10 NOTE — Consult Note (Signed)
General Aspect 81 yo female with history of cad s/p cabg at [facility] wtih a lima to the lad, svg to D2, svg to OM3, history of ppm for sick sinus syndrome who was admitted after suffering a fall when getting up from the commode causing a right tib/fib fracture. Pt states she was in her usual state of health priior to the event. She states she felt light headed when standing up and fell. Since admission, her tropoinin has been normal and her pace maker is funcitoning normallhy. She complains of leg pain but no chest pain or shortness of breath. She is hemodynamically stable.   Physical Exam:   GEN obese    HEENT PERRL, hearing intact to voice    NECK supple    RESP clear BS  no use of accessory muscles    CARD Regular rate and rhythm  Murmur    Murmur Systolic    Systolic Murmur axilla    ABD normal BS    LYMPH negative neck    EXTR positive edema    SKIN normal to palpation    NEURO cranial nerves intact, motor/sensory function intact    PSYCH A+O to time, place, person   Review of Systems:   Subjective/Chief Complaint bilateral leg pain    General: Fatigue  Weakness    Skin: No Complaints    ENT: No Complaints    Eyes: No Complaints    Neck: No Complaints    Respiratory: No Complaints    Cardiovascular: Edema    Gastrointestinal: No Complaints    Genitourinary: No Complaints    Musculoskeletal: Muscle or joint pain    Hematologic: No Complaints    Endocrine: No Complaints    Psychiatric: No Complaints    Review of Systems: All other systems were reviewed and found to be negative    Medications/Allergies Reviewed Medications/Allergies reviewed        Admit Reason:   Syncope and collapse: (780.2) Active, ICD9, Syncope and collapse  Home Medications: Medication Instructions Status  carvedilol 12.5 mg oral tablet 1 tab(s) orally 2 times a day Active  gabapentin 300 mg oral capsule 1 cap(s) orally once a day Active  lorazepam 1 mg oral tablet  0.5-1 tab(s) orally every 4 hours, As Needed- for Anxiety, Nervousness  Active  methocarbamol 500 mg oral tablet 1-2 tab(s) orally once a day (at bedtime), As Needed for muscle spasms Active  Tandem 162 mg-115.2 mg oral capsule 1 cap(s) orally once a day Active  tramadol 50 mg oral tablet 1-2 tab(s) orally every 6 hours, As Needed- for Pain  Active  Advair Diskus 500 mcg-50 mcg inhalation powder 1 puff(s) inhaled 2 times a day Active  Ventolin HFA 90 mcg/inh inhalation aerosol 2 puff(s) inhaled 4 times a day Active  Levemir 100 units/mL subcutaneous solution 10 unit(s) subcutaneous once a day (in the morning) and 36 units at bedtime Active  Humalog 100 units/mL subcutaneous solution 26 unit(s) subcutaneous once a day (in the morning) at breakfast, 20 units once a day at lunch, and 22 units once a day at dinner Active  Crestor 5 mg oral tablet 1 tab(s) orally once a day Active  aspirin 81 mg oral tablet 1 tab(s) orally once a day Active  escitalopram 20 mg oral tablet 1 tab(s) orally once a day Active  Nexium 40 mg oral delayed release capsule 1 cap(s) orally 2 times a day Active  amlodipine 5 mg oral tablet 1 tab(s) orally once a day  Active   EKG:   Interpretation paced rhythm    Iodine Strong: N/V, Itching, Swelling  Shellfish: N/V, Swelling, Other  Sulfa drugs: Itching, Swelling, Other  Ace Inhibitors: Cough  Codeine: Other  Band Aids: Other    Impression 81 yo female with history of cad s/p cabg, history of sick sinus syndrome with permenant pacemaker now admitted with a fall and tib/fib fracture. Fall appears to be secondary to orthostatic changes. Arita Miss is functioning normally. She has ruled out for mi. She needs urgent surgical intervention of her tib/fib fracture. She is hemodynamically stable and denies chest pain or shortness of breath. At this point in time, she is moderate to high risk from cardiac standpoint given previous cardiac issues but she appears well compensated from  cardiac standpoint. Would proceed with surgery with routine cardiac monitoring. Continue carvedilol during the perioperative period. Will follow up post up    Plan 1. Continue current meds including carvedilol 2. Proceed with surgery with routine cardiac monitorin. 3. Will follow up post op.   Electronic Signatures: Dalia Heading (MD)  (Signed 31-Jan-14 11:58)  Authored: General Aspect/Present Illness, History and Physical Exam, Review of System, Health Issues, Home Medications, EKG , Allergies, Impression/Plan   Last Updated: 31-Jan-14 11:58 by Dalia Heading (MD)

## 2014-12-10 NOTE — Consult Note (Signed)
Pt seen and examined. Please see Dawn Harrison's notes. Nausea preceded leg fx's. Will plan EGD tomorrow. Will follow. Thanks.  Electronic Signatures: Lutricia Feilh, Zitlali Primm (MD)  (Signed on 05-Feb-14 21:22)  Authored  Last Updated: 05-Feb-14 21:22 by Lutricia Feilh, Tonea Leiphart (MD)

## 2014-12-10 NOTE — Op Note (Signed)
PATIENT NAME:  Kristin Roberson, Regana K MR#:  [id number] DATE OF BIRTH:  1928/02/15  DATE OF PROCEDURE:  09/19/2012  PREOPERATIVE DIAGNOSIS: Distal spiral unstable right tibial fracture with segmental fibular fracture.   POSTOPERATIVE DIAGNOSIS: Distal spiral unstable right tibial fracture with segmental fibular fracture.   OPERATION: Open reduction and internal fixation, right tibia, with Biomet tibial VersaNail (11 mm x 33 cm and 3 locking screws).   SURGEON: Valinda HoarHoward E. Iseah Plouff, MD  ASSISTANT: Kathreen DevoidKevin L. Krasinski, MD   ANESTHESIA: General endotracheal.   COMPLICATIONS: None.   DRAINS: None.   ESTIMATED BLOOD LOSS: 50 mL.   REPLACED: None.   OPERATIVE PROCEDURE: The patient was brought to the operating room, where she underwent satisfactory general endotracheal anesthesia in the supine position. A spinal anesthetic was attempted, but was not possible. The right leg was prepped and draped in sterile fashion and placed on the tibial distractor device. The foot was fixed with the distractor with Coban and traction applied. Fluoroscopy showed the fracture reduction to be excellent. The proximal incision was made from the tibial tubercle proximal to the patella, just medial to the midline. Dissection was carried out bluntly through subcutaneous tissue. The fascia was incised, and the anterior border of the tibia was identified. A guidepin was inserted, and fluoroscopy showed it to be centered well in a good position. An enlarging hole was made, and the ball-tipped guide was then inserted. This was passed down across the fracture site to the old epiphyseal plate easily. The tibia was quite wide. She was reamed to 12.5 mm. An 11 mm x 33 cm rod was chosen, and this was passed down the tibia without difficulty. The guidepin was removed. Because the fracture was so low, the rod was advanced to just above the joint. The stab wound was made anteromedially, and an oblique locking screw was placed proximally. Distally,  an anterior-to-posterior, and very distal medial-to-lateral screws were placed through stab wounds under fluoroscopic control. This fixed the fracture snugly. The traction had been released prior to the final screws being inserted. The wounds were irrigated. Stab wounds were closed with staples. A locking cap was placed on the top of the rod, and bone wax used to fill the proximal tibia to minimize bleeding. After irrigation, the capsule was closed with 2-0 Vicryl. The subcutaneous tissue was closed with 2-0 Vicryl, and the skin was closed with staples. Dry sterile dressing was applied, and the patient was placed in a posterior short leg splint. The tourniquet was not used. The left leg had shown a small fibular fracture on x-ray, and a well-padded short leg splint was applied to the left ankle. She was then awakened and transferred to her hospital bed and taken to recovery in good condition.  ____________________________ Valinda HoarHoward E. Sharisa Toves, MD hem:OSi D: 09/19/2012 15:41:54 ET T: 09/20/2012 13:27:05 ET JOB#: [id number]  cc: Valinda HoarHoward E. Charlese Gruetzmacher, MD, <Dictator> Valinda HoarHOWARD E Tyonna Talerico MD ELECTRONICALLY SIGNED 09/21/2012 13:00

## 2014-12-10 NOTE — Consult Note (Signed)
Brief Consult Note: Diagnosis: Nausea and dry heaving.  S/p fall with fractures to bilateral lower extremities.  Diabetes Mellitus.   Orders entered.   Discussed with Attending MD.   Comments: Patient's presentation discussed with Dr. Lutricia FeilPaul Oh.  Will proceed with EGD tomorrow to allow direct luminal evaluation.  Possible dilatation at the time.  Complaint of dysphagia.  CBC and PT/INR odered for am.  Hold lovenox this evening.  NPO after midnight.  Electronic Signatures: Rodman KeyHarrison, Jannetta Massey S (NP)  (Signed 05-Feb-14 17:32)  Authored: Brief Consult Note   Last Updated: 05-Feb-14 17:32 by Rodman KeyHarrison, Denver Harder S (NP)

## 2014-12-10 NOTE — Consult Note (Signed)
PATIENT NAME:  Kristin FraterUCKER, Ocia K MR#:  [id number] DATE OF BIRTH:  [birth year]  DATE OF CONSULTATION:  09/24/2012  ADDENDUM  CONSULTING PHYSICIAN:  Rodman Keyawn S. Ashley Bultema, NP  PLAN: The patient's presentation was discussed with Dr. Lutricia FeilPaul Oh. We will proceed with EGD tomorrow to allow direct luminal evaluation of upper GI tract and assess for source of possible nausea as well as the concern of dysphagia as dilatation may need to be performed. Order placed n.p.o. after midnight. CBC and a PT-INR are ordered for the morning. As the patient has displayed evidence of anemia during her hospitalization, I wish to make sure that her hemoglobin level is at a safe level to be able to proceed forward with endoscopy tomorrow. Hold Lovenox this evening.   These services provided by Rodman Keyawn S. Jenea Dake, MS, APRN, [facility], FNP, under collaborative agreement with Lutricia FeilPaul Oh, MD.      ____________________________ Rodman Keyawn S. Melysa Schroyer, NP dsh:cb D: 09/24/2012 17:32:24 ET T: 09/24/2012 18:10:50 ET JOB#: [id number]  cc: Rodman Keyawn S. Mikah Poss, NP, <Dictator> Rodman KeyAWN S Naquita Nappier MD ELECTRONICALLY SIGNED 09/29/2012 7:49

## 2014-12-10 NOTE — Consult Note (Signed)
PATIENT NAME:  Kristin Roberson, Kristin Roberson MR#:  [id number] DATE OF BIRTH:  1927-09-28  DATE OF CONSULTATION:  09/18/2012  CONSULTING PHYSICIAN:  Valinda HoarHoward E. Rosana Farnell, MD  CHIEF COMPLAINT:  Right leg pain and left knee pain.   HISTORY OF PRESENT ILLNESS: The patient is an 81 year old female with multiple medical problems who had a syncopal episode while changing her colostomy bag at home today. She fell, suffered severe injury to the right leg and left knee. Her grandson was able to get her up off of the floor onto the commode and she was brought to the Emergency Room by EMS. Exam and x-rays revealed a displaced spiral fracture of the right distal tibia with segmental fibular shaft fractures. She is osteopenic. Left knee and right foot x-rays are pending. The patient's family is in attendance.  PAST MEDICAL HISTORY AND ILLNESS:  Diabetes, atherosclerotic heart disease, prior heart attack with triple bypass, pacemaker incision, rectal cancer, hypertension, asthma.   OPERATIONS:  Left shoulder hemiarthroplasty, sigmoid resection with colostomy, tonsillectomy, partial hysterectomy, back surgery, triple bypass colostomy.  MEDICATIONS:  Ventolin, tramadol, Tandem, Nexium, methocarbamol, lorazepam, Levemir, Humalog, gabapentin, escitalopram, Crestor, carvedilol, aspirin 81 mg, amlodipine and Advair Diskus.   ALLERGIES:  CODEINE, ACE INHIBITORS, IODINE, SULFA DRUGS, SHELLFISH AND BAND-AIDS.   REVIEW OF SYSTEMS:  Otherwise unremarkable.   FAMILY HISTORY:  Unremarkable.   SOCIAL HISTORY:  The patient does not smoke or drink. She lives at home with her husband. She is retired.   PHYSICAL EXAMINATION:  Generally healthy overweight female in no acute distress. She is lying on a stretcher in the Emergency Room. Her husband and daughter are in attendance. The upper extremities and abdomen are nontender. The hips move without pain. The right lower extremity shows swelling in distal tibia and fibula. She has paper-thin skin  and has bruising over the distal anterior tibia but with some superficial skin loss, but no punctures of the skin. She has some bruising in the distal forefoot.  Pulses are not palpable. The foot is warm and is equal to the other foot. The left leg shows an abrasion over the left patella. There is no swelling or bruising. The lower leg itself is nontender.  X-rays of the right tibia and fibula show displaced spiral distal tibial fracture with segmental fibula fractures.   DIAGNOSIS: Unstable displaced spiral distal tibial fracture with fibular fractures.  RECOMMENDATION:  I discussed treatment options with the patient and her family. Ideally surgical stabilization of this fracture with an intramedullary rod would be the preferred treatment. This would avoid most skin problems. This cannot be done until the patient has been fully evaluated by the medical service due to her syncope. We will plan to proceed with surgery tomorrow if she is cleared medically. This was discussed with the patient and her family and they agree to proceed with this as soon as possible.    ____________________________ Valinda HoarHoward E. Yohanna Tow, MD hem:ce D: 09/18/2012 18:00:28 ET T: 09/18/2012 18:23:53 ET JOB#: [id number]  cc: Valinda HoarHoward E. Yvaine Jankowiak, MD, <Dictator>  Einar CrowMarshall Anderson, MD   Valinda HoarHOWARD E Kentravious Lipford MD ELECTRONICALLY SIGNED 09/19/2012 15:46
